# Patient Record
Sex: Male | Born: 1980 | Race: White | Hispanic: No | Marital: Single | Smoking: Former smoker
Health system: Southern US, Community
[De-identification: ages and names within clinical notes are randomized; demographics above are authoritative.]

## PROBLEM LIST (undated history)

## (undated) DIAGNOSIS — Q059 Spina bifida, unspecified: Secondary | ICD-10-CM

---

## 2017-12-21 ENCOUNTER — Ambulatory Visit (HOSPITAL_COMMUNITY)
Admission: EM | Admit: 2017-12-21 | Discharge: 2017-12-21 | Disposition: A | Discharge status: Home / Self Care | Payer: Self-pay

## 2017-12-21 NOTE — ED Notes (Signed)
Per pt access, pt lwbs 

## 2020-01-22 ENCOUNTER — Emergency Department (HOSPITAL_BASED_OUTPATIENT_CLINIC_OR_DEPARTMENT_OTHER)
Admission: EM | Admit: 2020-01-22 | Discharge: 2020-01-23 | Disposition: A | Discharge status: Home / Self Care | Payer: 59 | Attending: Emergency Medicine | Admitting: Emergency Medicine

## 2020-01-22 ENCOUNTER — Other Ambulatory Visit: Payer: Self-pay

## 2020-01-22 ENCOUNTER — Emergency Department (HOSPITAL_BASED_OUTPATIENT_CLINIC_OR_DEPARTMENT_OTHER): Payer: 59

## 2020-01-22 ENCOUNTER — Encounter (HOSPITAL_BASED_OUTPATIENT_CLINIC_OR_DEPARTMENT_OTHER): Payer: Self-pay | Admitting: Emergency Medicine

## 2020-01-22 DIAGNOSIS — Z87891 Personal history of nicotine dependence: Secondary | ICD-10-CM | POA: Insufficient documentation

## 2020-01-22 DIAGNOSIS — M79605 Pain in left leg: Secondary | ICD-10-CM

## 2020-01-22 DIAGNOSIS — M79652 Pain in left thigh: Secondary | ICD-10-CM | POA: Insufficient documentation

## 2020-01-22 HISTORY — DX: Spina bifida, unspecified: Q05.9

## 2020-01-22 MED ORDER — IBUPROFEN 800 MG PO TABS
800.0000 mg | ORAL_TABLET | Freq: Once | ORAL | Status: AC
  Administered 2020-01-22: 800 mg via ORAL
  Filled 2020-01-22: qty 1

## 2020-01-22 NOTE — ED Provider Notes (Signed)
[facility] DEPART[facility] Provider Note   CSN: [id number] Arrival date & time: 01/22/20  2122     History Chief Complaint  Patient presents with  . Leg Pain    Carl Valenzuela is a 39 y.o. male.  Patient with history of spina bifida presenting with a 3-day history of upper left leg pain and swelling.  Denies any fall or trauma.  States he noticed the pain while he was working several days ago and today he thinks the leg is more swollen compared to the other side.  There is been no fever, chills, nausea or vomiting.  No focal weakness, numbness or tingling.  No chest pain or shortness of breath. History of "skin graft" to this leg as a child for spina bifida.  Denies any previous injury to this leg.  Denies any back pain.  No bowel or bladder incontinence.  No abdominal pain, vomiting, pain with urination or blood in the urine.  The history is provided by the patient.  Leg Pain Associated symptoms: no fatigue and no fever        Past Medical History:  Diagnosis Date  . Spina bifida ([place])     There are no problems to display for this patient.   History reviewed. No pertinent surgical history.     History reviewed. No pertinent family history.  Social History   Tobacco Use  . Smoking status: Former Research scientist (life sciences)  . Smokeless tobacco: Never Used  Substance Use Topics  . Alcohol use: Not on file  . Drug use: Not on file    Home Medications Prior to Admission medications   Medication Sig Start Date End Date Taking? Authorizing Provider  buPROPion (WELLBUTRIN XL) 300 MG 24 hr tablet  07/28/19  Yes [provider]  methocarbamol (ROBAXIN) 500 MG tablet  11/24/19  Yes [provider]  omeprazole (PRILOSEC) 20 MG capsule  11/10/19  Yes [provider]  tadalafil (CIALIS) 10 MG tablet Take by mouth. 09/08/18  Yes [provider]    Allergies    Patient has no known allergies.  Review of Systems   Review of Systems    Constitutional: Negative for activity change, appetite change, fatigue and fever.  HENT: Negative for congestion and rhinorrhea.   Respiratory: Negative for cough, chest tightness and shortness of breath.   Cardiovascular: Negative for chest pain.  Gastrointestinal: Negative for nausea and vomiting.  Genitourinary: Negative for dysuria and hematuria.  Musculoskeletal: Positive for arthralgias and myalgias.  Skin: Negative for rash.  Neurological: Negative for dizziness, weakness and headaches.   all other systems are negative except as noted in the HPI and PMH.    Physical Exam Updated Vital Signs BP (!) 122/92 (BP Location: Left Arm)   Pulse 94   Temp 98 F (36.7 C) (Oral)   Resp 18   Ht 5\' 10"  (1.778 m)   Wt 106.6 kg   SpO2 100%   BMI 33.72 kg/m   Physical Exam Vitals and nursing note reviewed.  Constitutional:      General: He is not in acute distress.    Appearance: He is well-developed. He is obese.  HENT:     Head: Normocephalic and atraumatic.     Mouth/Throat:     Pharynx: No oropharyngeal exudate.  Eyes:     Conjunctiva/sclera: Conjunctivae normal.     Pupils: Pupils are equal, round, and reactive to light.  Neck:     Comments: No meningismus. Cardiovascular:     Rate  and Rhythm: Normal rate and regular rhythm.     Heart sounds: Normal heart sounds. No murmur heard.   Pulmonary:     Effort: Pulmonary effort is normal. No respiratory distress.     Breath sounds: Normal breath sounds.  Abdominal:     Palpations: Abdomen is soft.     Tenderness: There is no abdominal tenderness. There is no guarding or rebound.  Genitourinary:    Comments: Testicles nontender, no appreciable hernia Musculoskeletal:        General: Tenderness present. Normal range of motion.     Cervical back: Normal range of motion and neck supple.     Comments: Diffuse tenderness across left upper anterior thigh.  There is no warmth or erythema.  There is no fluctuance.  Lateral thigh  has a well-healed surgical incision with chronic surgical changes.  Intact DP and PT pulses.  Full range of motion of hip and knee.  Compartments soft. No appreciable asymmetry  Skin:    General: Skin is warm.  Neurological:     Mental Status: He is alert and oriented to person, place, and time.     Cranial Nerves: No cranial nerve deficit.     Motor: No abnormal muscle tone.     Coordination: Coordination normal.     Comments: No ataxia on finger to nose bilaterally. No pronator drift. 5/5 strength throughout. CN 2-12 intact.Equal grip strength. Sensation intact.   Psychiatric:        Behavior: Behavior normal.     ED Results / Procedures / Treatments   Labs (all labs ordered are listed, but only abnormal results are displayed) Labs Reviewed - No data to display  EKG None  Radiology DG Hip Unilat W or Wo Pelvis 2-3 Views Left  Result Date: 01/22/2020 CLINICAL DATA:  Pain EXAM: DG HIP (WITH OR WITHOUT PELVIS) 2-3V LEFT; LEFT FEMUR 2 VIEWS COMPARISON:  None. FINDINGS: There is no evidence of hip fracture or dislocation. 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 Phleboliths project over the patient's pelvis. IMPRESSION: Negative. Electronically Signed   By: Katherine Mantle M.D.   On: 01/22/2020 23:51   DG Femur Min 2 Views Left  Result Date: 01/22/2020 CLINICAL DATA:  Pain EXAM: DG HIP (WITH OR WITHOUT PELVIS) 2-3V LEFT; LEFT FEMUR 2 VIEWS COMPARISON:  None. FINDINGS: There is no evidence of hip fracture or dislocation. 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 Phleboliths project over the patient's pelvis. IMPRESSION: Negative. Electronically Signed   By: Katherine Mantle M.D.   On: 01/22/2020 23:51    Procedures Procedures (including critical care time)  Medications Ordered in ED Medications  ibuprofen (ADVIL) tablet 800 mg (800 mg Oral Given 01/22/20 2337)    ED Course  I have reviewed the triage vital  signs and the nursing notes.  Pertinent labs & imaging results that were available during my care of the patient were reviewed by me and considered in my medical decision making (see chart for details).    MDM Rules/Calculators/A&P                         Left upper thigh pain without acute trauma.  Neurovascularly intact.  Given previous surgery will check plain film x-rays.  No inguinal hernia visible on exam.  Intact distal pulses. Would benefit from DVT study in the morning  X-rays are negative.  Patient in no distress.  Will  treat with anti-inflammatories.  Arrange for follow-up Doppler study in the morning.  Patient agreeable.  Return precautions discussed Final Clinical Impression(s) / ED Diagnoses Final diagnoses:  Left leg pain    Rx / DC Orders ED Discharge Orders         Ordered    US Venous Img Lower Unilateral Left     Discontinue     01/23/20 0036           Glynn Octave, MD 01/23/20 [id number]

## 2020-01-22 NOTE — ED Triage Notes (Signed)
Patient presents with complaints of upper left leg pain and swelling; states pain in left thigh area onset 2-3 days ago with swelling onset today; denies any known injury; ambulatory with steady gait.

## 2020-01-23 ENCOUNTER — Ambulatory Visit (HOSPITAL_BASED_OUTPATIENT_CLINIC_OR_DEPARTMENT_OTHER)
Admit: 2020-01-23 | Discharge: 2020-01-23 | Disposition: A | Discharge status: Home / Self Care | Payer: 59 | Attending: Emergency Medicine | Admitting: Emergency Medicine

## 2020-01-23 NOTE — Discharge Instructions (Signed)
Use Tylenol or Motrin as needed for pain.  Your x-rays are negative.  Return tomorrow for an ultrasound of your leg to rule out blood clots.  Return to the ED sooner with new or worsening symptoms.

## 2021-02-08 IMAGING — CR DG HIP (WITH OR WITHOUT PELVIS) 2-3V*L*
3 series · 3 of 3 positions shown · non-contrast
Comparison: None.

CLINICAL DATA: Pain

EXAM:
DG HIP (WITH OR WITHOUT PELVIS) 2-3V LEFT; LEFT FEMUR 2 VIEWS

[t pelvis a.p.]
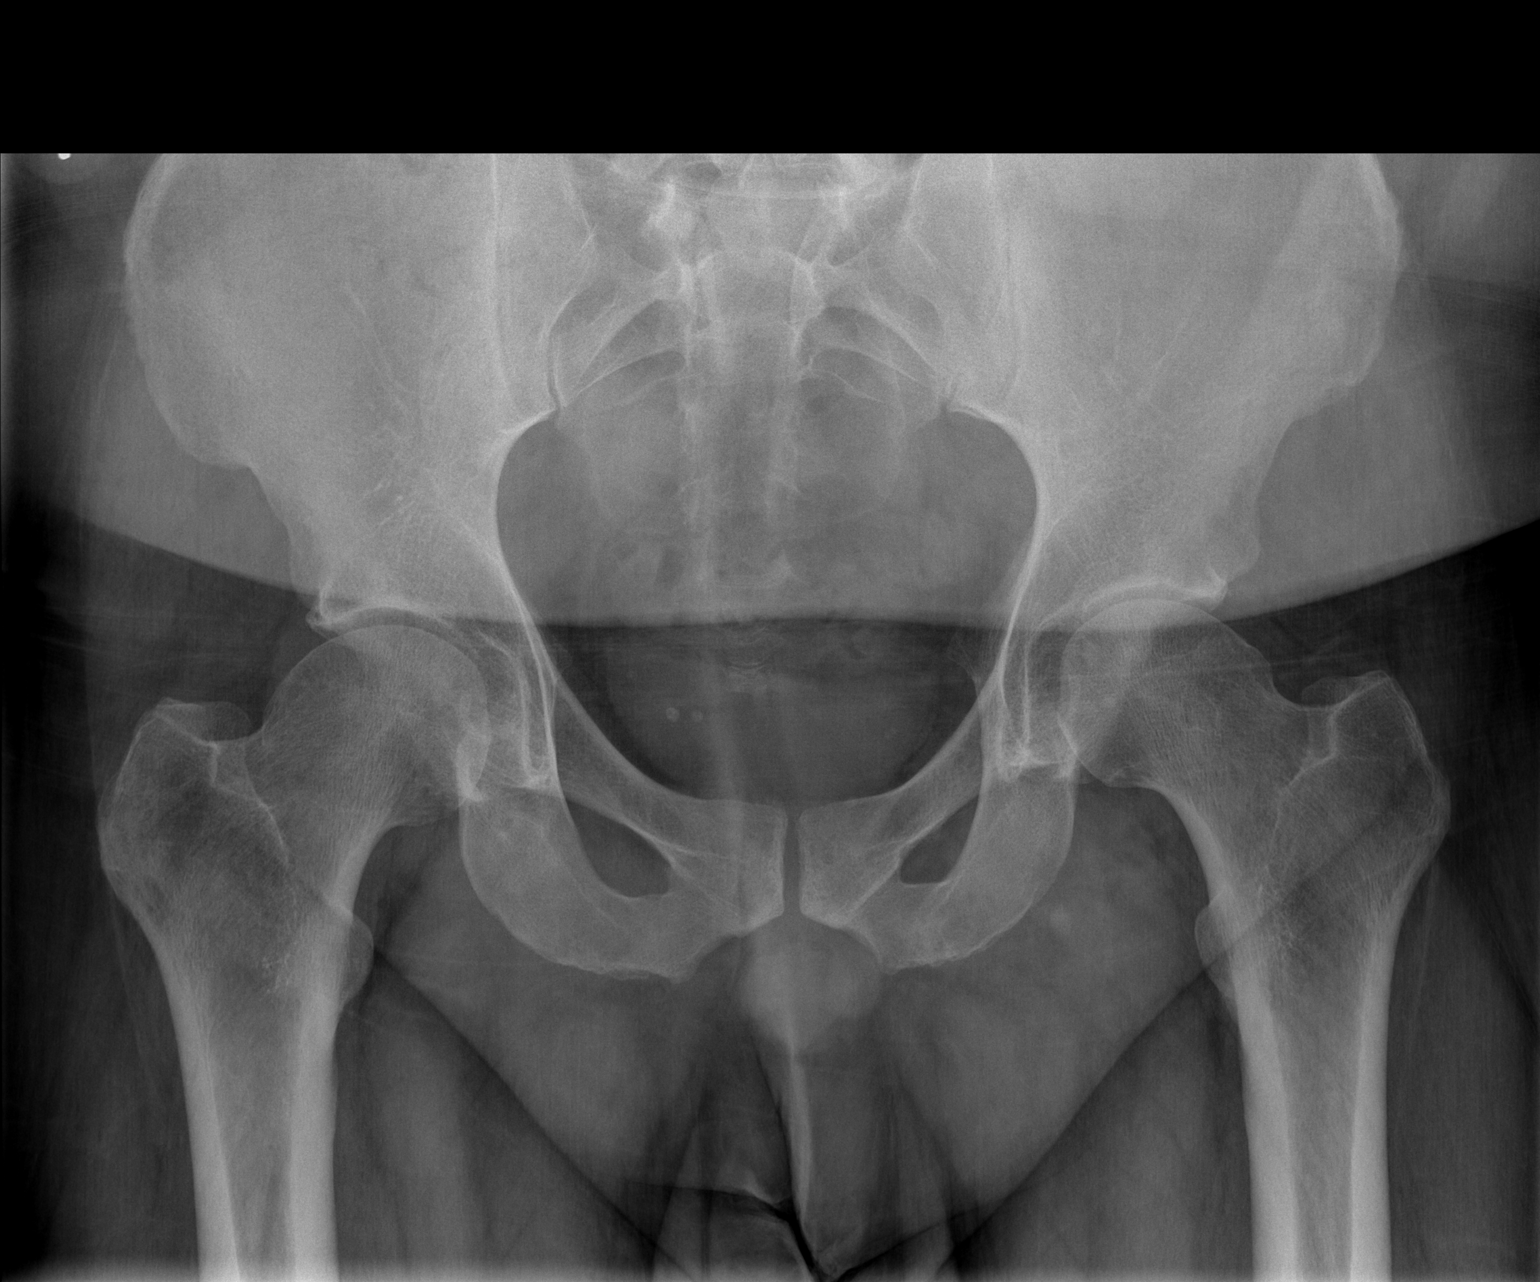

[t hip ap left]
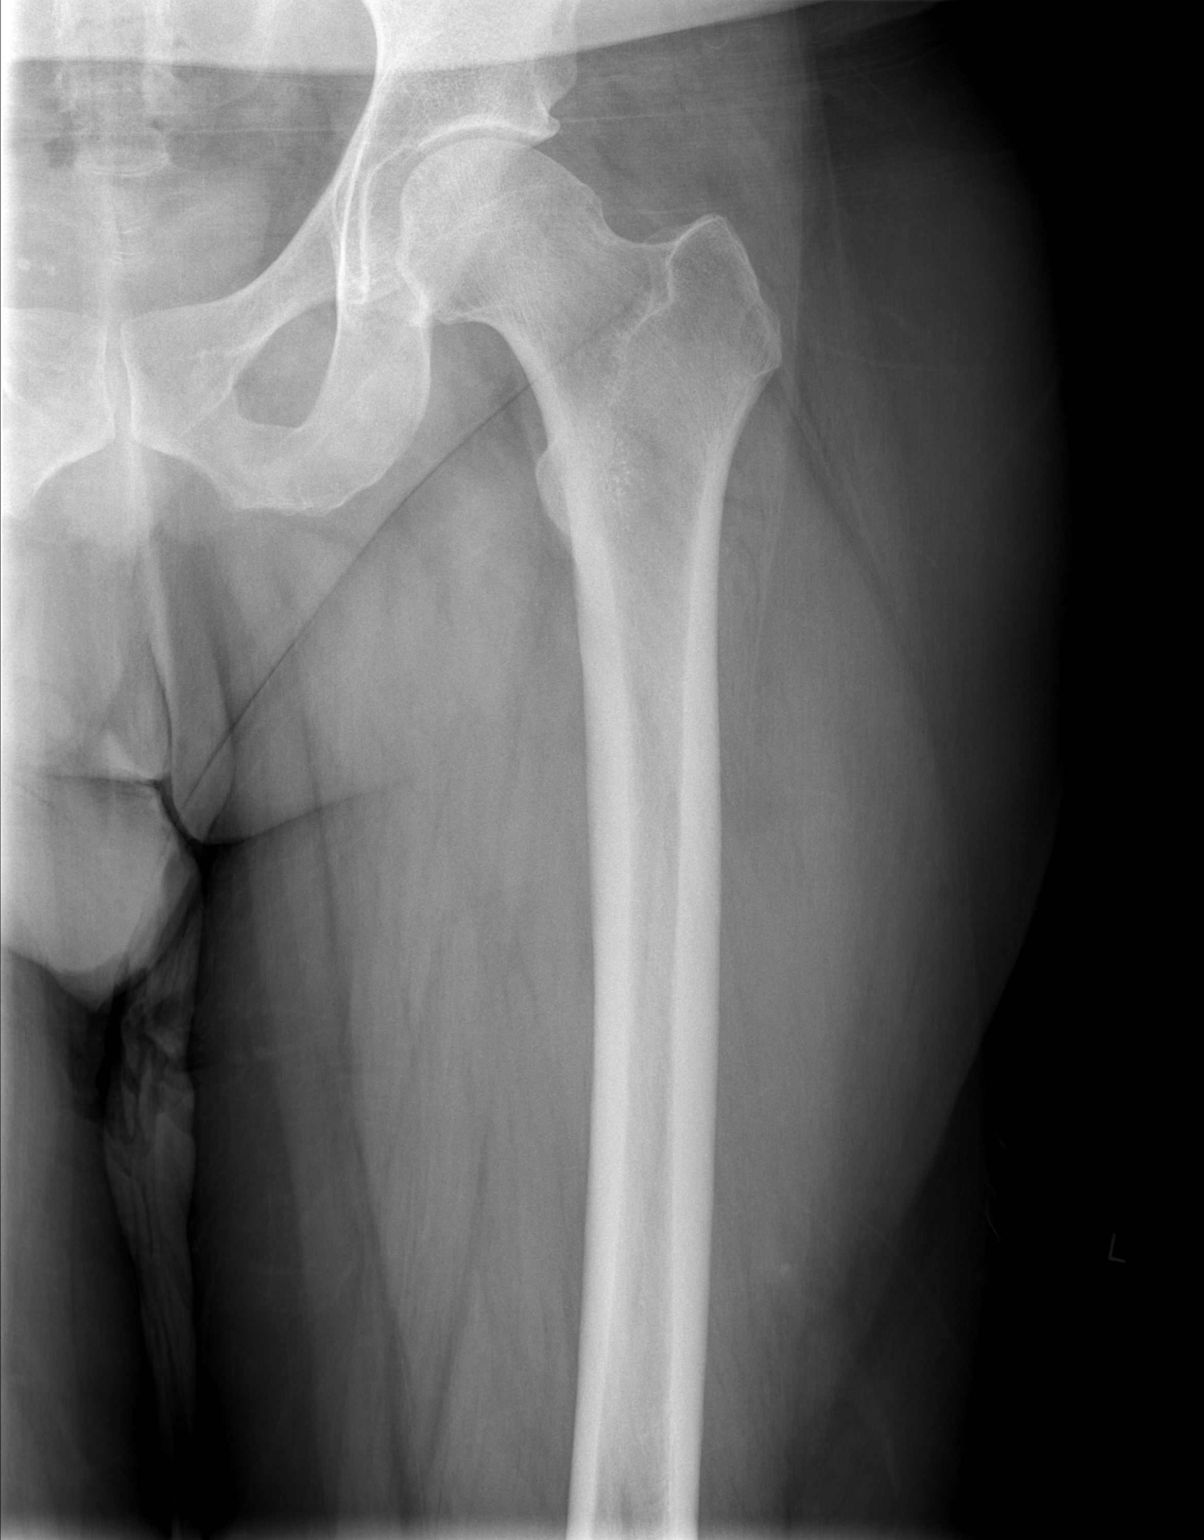

[t hip frog leg left]
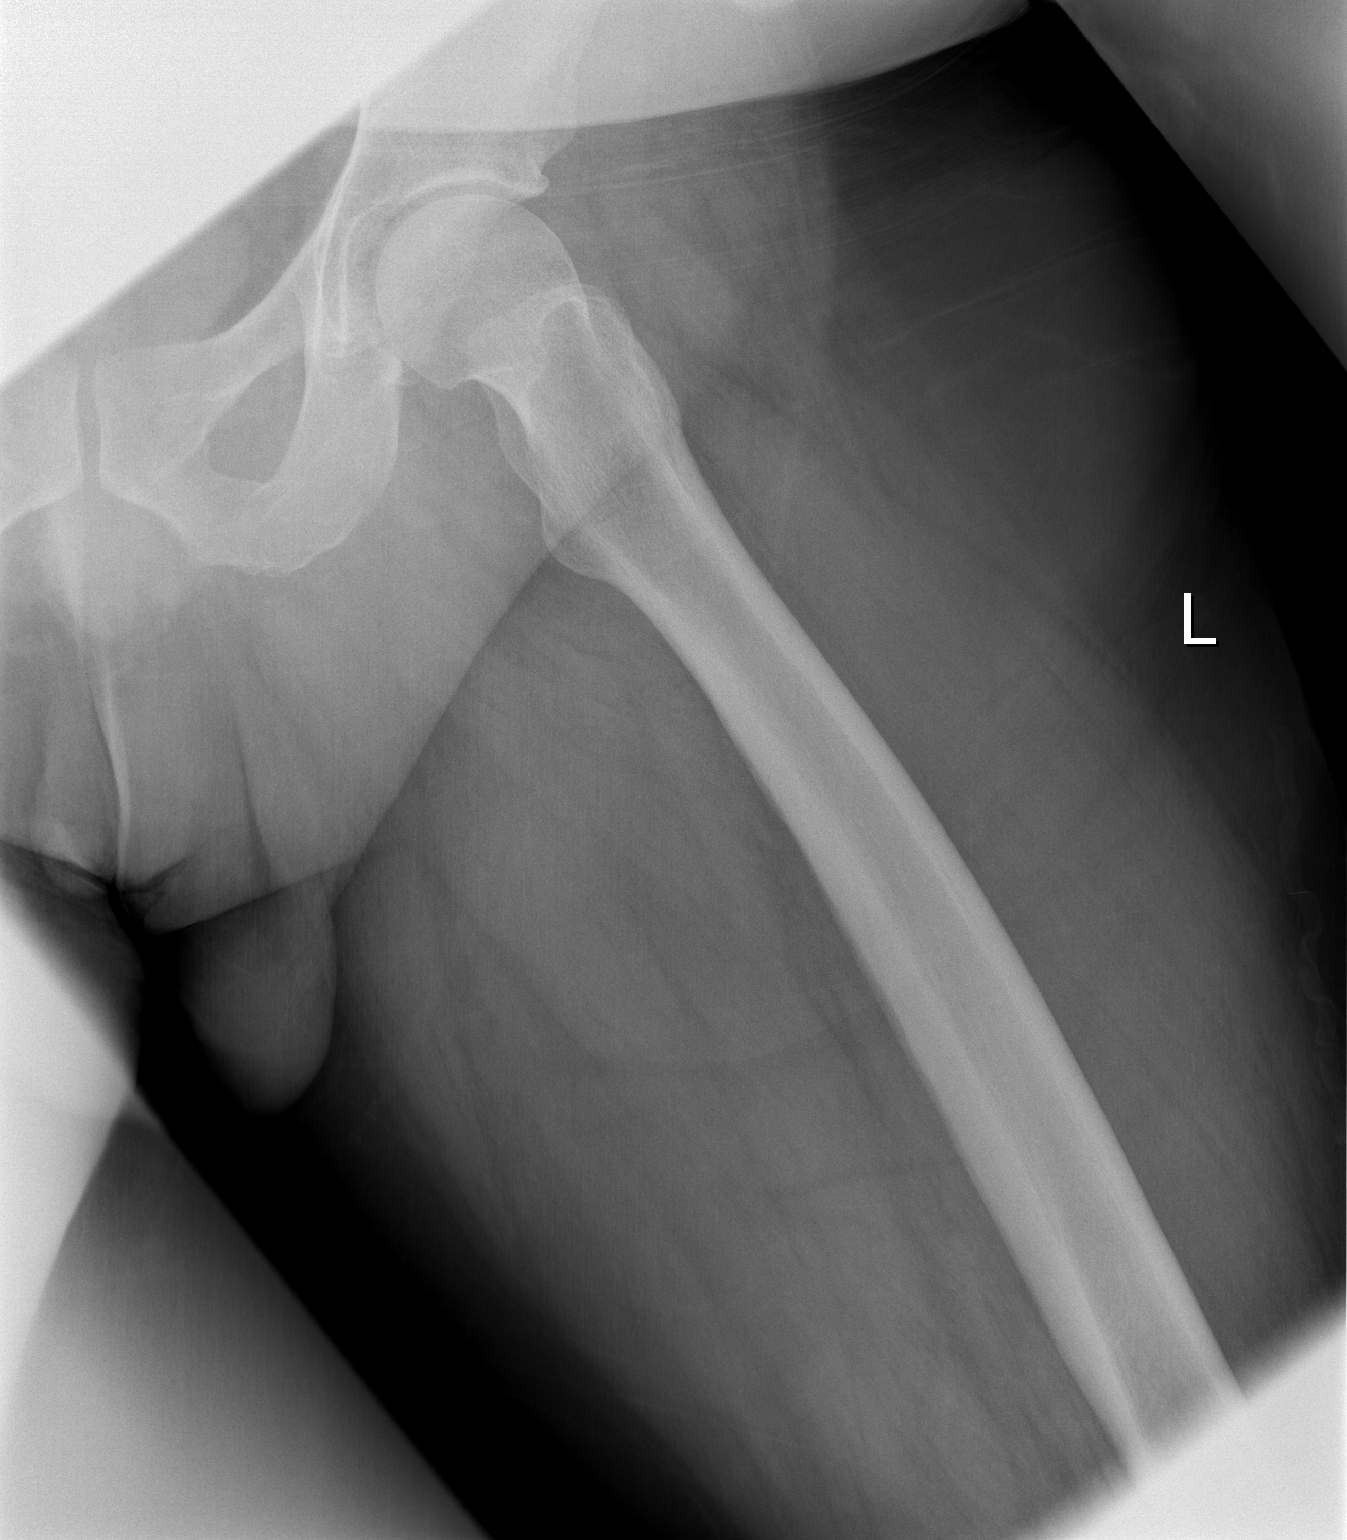

[3 of 3 positions shown; findings below may reference images not displayed]

FINDINGS: There is no evidence of hip fracture or dislocation. There are mild
degenerative changes of both hips, left greater than right.
Phleboliths project over the patient's pelvis.
IMPRESSION: Negative.
